# Patient Record
Sex: Female | Born: 1970 | Race: Black or African American | Hispanic: No | Marital: Married | Smoking: Never smoker
Health system: Southern US, Community
[De-identification: ages and names within clinical notes are randomized; demographics above are authoritative.]

## PROBLEM LIST (undated history)

## (undated) HISTORY — PX: TUBAL LIGATION: SHX77

## (undated) HISTORY — PX: CHOLECYSTECTOMY: SHX55

---

## 2019-12-23 ENCOUNTER — Encounter (HOSPITAL_COMMUNITY): Payer: Self-pay | Admitting: Emergency Medicine

## 2019-12-23 ENCOUNTER — Emergency Department (HOSPITAL_COMMUNITY): Payer: 59

## 2019-12-23 ENCOUNTER — Emergency Department (HOSPITAL_COMMUNITY)
Admission: EM | Admit: 2019-12-23 | Discharge: 2019-12-23 | Disposition: A | Discharge status: Home / Self Care | Payer: 59 | Attending: Emergency Medicine | Admitting: Emergency Medicine

## 2019-12-23 ENCOUNTER — Other Ambulatory Visit: Payer: Self-pay

## 2019-12-23 DIAGNOSIS — Z79899 Other long term (current) drug therapy: Secondary | ICD-10-CM | POA: Diagnosis not present

## 2019-12-23 DIAGNOSIS — R109 Unspecified abdominal pain: Secondary | ICD-10-CM | POA: Diagnosis not present

## 2019-12-23 DIAGNOSIS — R197 Diarrhea, unspecified: Secondary | ICD-10-CM | POA: Insufficient documentation

## 2019-12-23 DIAGNOSIS — R0789 Other chest pain: Secondary | ICD-10-CM | POA: Insufficient documentation

## 2019-12-23 DIAGNOSIS — Z20822 Contact with and (suspected) exposure to covid-19: Secondary | ICD-10-CM | POA: Diagnosis not present

## 2019-12-23 LAB — COMPREHENSIVE METABOLIC PANEL
ALT: 35 U/L (ref 0–44)
AST: 27 U/L (ref 15–41)
Albumin: 4 g/dL (ref 3.5–5.0)
Alkaline Phosphatase: 72 U/L (ref 38–126)
Anion gap: 12 (ref 5–15)
BUN: 10 mg/dL (ref 6–20)
CO2: 24 mmol/L (ref 22–32)
Calcium: 9.2 mg/dL (ref 8.9–10.3)
Chloride: 102 mmol/L (ref 98–111)
Creatinine, Ser: 0.86 mg/dL (ref 0.44–1.00)
GFR calc Af Amer: >60 mL/min (ref >60)
GFR calc non Af Amer: >60 mL/min (ref >60)
Glucose, Bld: 93 mg/dL (ref 70–99)
Potassium: 4 mmol/L (ref 3.5–5.1)
Sodium: 138 mmol/L (ref 135–145)
Total Bilirubin: 0.5 mg/dL (ref 0.3–1.2)
Total Protein: 7.5 g/dL (ref 6.5–8.1)

## 2019-12-23 LAB — URINALYSIS, ROUTINE W REFLEX MICROSCOPIC
Bilirubin Urine: NEGATIVE
Glucose, UA: NEGATIVE mg/dL
Ketones, ur: NEGATIVE mg/dL
Leukocytes,Ua: NEGATIVE
Nitrite: NEGATIVE
Protein, ur: NEGATIVE mg/dL
Specific Gravity, Urine: 1.008 (ref 1.005–1.030)
pH: 6 (ref 5.0–8.0)

## 2019-12-23 LAB — I-STAT BETA HCG BLOOD, ED (MC, WL, AP ONLY): I-stat hCG, quantitative: <5 m[IU]/mL (ref <5)

## 2019-12-23 LAB — CBC
HCT: 42 % (ref 36.0–46.0)
Hemoglobin: 14.1 g/dL (ref 12.0–15.0)
MCH: 29.9 pg (ref 26.0–34.0)
MCHC: 33.6 g/dL (ref 30.0–36.0)
MCV: 89 fL (ref 80.0–100.0)
Platelets: 275 10*3/uL (ref 150–400)
RBC: 4.72 MIL/uL (ref 3.87–5.11)
RDW: 12.8 % (ref 11.5–15.5)
WBC: 6.1 10*3/uL (ref 4.0–10.5)
nRBC: 0 % (ref 0.0–0.2)

## 2019-12-23 LAB — PROTIME-INR
INR: 1 (ref 0.8–1.2)
Prothrombin Time: 12.7 seconds (ref 11.4–15.2)

## 2019-12-23 LAB — APTT: aPTT: 28 seconds (ref 24–36)

## 2019-12-23 LAB — LIPASE, BLOOD: Lipase: 30 U/L (ref 11–51)

## 2019-12-23 LAB — SARS CORONAVIRUS 2 BY RT PCR (HOSPITAL ORDER, PERFORMED IN ~~LOC~~ HOSPITAL LAB): SARS Coronavirus 2: NEGATIVE

## 2019-12-23 MED ORDER — SODIUM CHLORIDE 0.9 % IV BOLUS
1000.0000 mL | Freq: Once | INTRAVENOUS | Status: AC
  Administered 2019-12-23: 1000 mL via INTRAVENOUS

## 2019-12-23 MED ORDER — ONDANSETRON HCL 4 MG/2ML IJ SOLN
4.0000 mg | Freq: Once | INTRAMUSCULAR | Status: AC
  Administered 2019-12-23: 4 mg via INTRAVENOUS
  Filled 2019-12-23: qty 2

## 2019-12-23 MED ORDER — ACETAMINOPHEN 325 MG PO TABS
650.0000 mg | ORAL_TABLET | Freq: Once | ORAL | Status: AC
  Administered 2019-12-23: 650 mg via ORAL
  Filled 2019-12-23: qty 2

## 2019-12-23 MED ORDER — SODIUM CHLORIDE 0.9% FLUSH: 3.0000 mL | Freq: Once | INTRAVENOUS | Status: DC

## 2019-12-23 MED ORDER — SODIUM CHLORIDE (PF) 0.9 % IJ SOLN
INTRAMUSCULAR | Status: AC
  Filled 2019-12-23: qty 50

## 2019-12-23 MED ORDER — IOHEXOL 300 MG/ML  SOLN
100.0000 mL | Freq: Once | INTRAMUSCULAR | Status: AC | PRN
  Administered 2019-12-23: 100 mL via INTRAVENOUS

## 2019-12-23 NOTE — ED Provider Notes (Signed)
[facility]-EMERGENCY DEPT Provider Note   CSN: [id number] Arrival date & time: 12/23/19  1828     History Chief Complaint  Patient presents with  . Abdominal Pain  . Shortness of Breath  . Diarrhea    Kristina Mathis is a 49 y.o. female.  HPI Patient with no significant PMH reports 2 days of diarrhea, initially watery brown and then changed to green today. Associated with some nausea and a single episode of self limited sharp L upper chest wall pain. No cough. She began running a fever today. Mild L lower abdominal pain. No dysuria. No recent antibiotics. She took Imodium before coming to the hospital. She has had two Covid vaccines, but not yet 3 weeks since last dose.     History reviewed. No pertinent past medical history.  There are no problems to display for this patient.   Past Surgical History:  Procedure Laterality Date  . CHOLECYSTECTOMY    . TUBAL LIGATION       OB History   No obstetric history on file.     No family history on file.  Social History   Tobacco Use  . Smoking status: Not on file  Substance Use Topics  . Alcohol use: Not on file  . Drug use: Not on file    Home Medications Prior to Admission medications   Medication Sig Start Date End Date Taking? Authorizing Provider  FIBER PO Take 2 capsules by mouth daily.   Yes [provider]  TURMERIC PO Take 2 capsules by mouth once.   Yes [provider]    Allergies    Patient has no known allergies.  Review of Systems   Review of Systems A comprehensive review of systems was completed and negative except as noted in HPI.   Physical Exam Updated Vital Signs BP 122/88   Pulse 69   Temp (!) 100.4 F (38 C) (Oral)   Resp 18   LMP 09/22/2019   SpO2 97%   Physical Exam Vitals and nursing note reviewed.  Constitutional:      Appearance: Normal appearance.  HENT:     Head: Normocephalic and atraumatic.     Nose: Nose normal.   Mouth/Throat:     Mouth: Mucous membranes are moist.  Eyes:     Extraocular Movements: Extraocular movements intact.     Conjunctiva/sclera: Conjunctivae normal.  Cardiovascular:     Rate and Rhythm: Normal rate.  Pulmonary:     Effort: Pulmonary effort is normal.     Breath sounds: Normal breath sounds.  Chest:     Chest wall: No tenderness.  Abdominal:     General: Abdomen is flat.     Palpations: Abdomen is soft.     Tenderness: There is no abdominal tenderness. There is no guarding. Negative signs include Murphy's sign.  Musculoskeletal:        General: No swelling. Normal range of motion.     Cervical back: Neck supple.  Skin:    General: Skin is warm and dry.  Neurological:     General: No focal deficit present.     Mental Status: She is alert.  Psychiatric:        Mood and Affect: Mood normal.     ED Results / Procedures / Treatments   Labs (all labs ordered are listed, but only abnormal results are displayed) Labs Reviewed  URINALYSIS, ROUTINE W REFLEX MICROSCOPIC - Abnormal; Notable for the following components:  Result Value   Color, Urine STRAW (*)    Hgb urine dipstick SMALL (*)    Bacteria, UA RARE (*)    All other components within normal limits  SARS CORONAVIRUS 2 BY RT PCR (HOSPITAL ORDER, PERFORMED IN [facility])  LIPASE, BLOOD  COMPREHENSIVE METABOLIC PANEL  CBC  PROTIME-INR  APTT  I-STAT BETA HCG BLOOD, ED ([facility], [facility], [facility] ONLY)    EKG EKG Interpretation  Date/Time:  Sunday December 23 2019 18:40:04 EDT Ventricular Rate:  75 PR Interval:    QRS Duration: 96 QT Interval:  361 QTC Calculation: 404 R Axis:   48 Text Interpretation: Sinus rhythm RSR' in V1 or V2, right VCD or RVH 12 Lead; Mason-Likar No old tracing to compare Confirmed by Meridee Score [phone]) on 12/23/2019 7:07:03 PM   Radiology DG Chest 2 View  Result Date: 12/23/2019 CLINICAL DATA:  Shortness of breath. Left-sided abdominal pain. Fever. Diarrhea. EXAM: CHEST -  2 VIEW COMPARISON:  None. FINDINGS: The cardiomediastinal contours are normal. Mild elevation of right hemidiaphragm. Pulmonary vasculature is normal. No consolidation, pleural effusion, or pneumothorax. No acute osseous abnormalities are seen. IMPRESSION: No acute chest findings. Electronically Signed   By: Narda Rutherford M.D.   On: 12/23/2019 19:11   CT Abdomen Pelvis W Contrast  Result Date: 12/23/2019 CLINICAL DATA:  Diarrhea, left-sided abdominal pain EXAM: CT ABDOMEN AND PELVIS WITH CONTRAST TECHNIQUE: Multidetector CT imaging of the abdomen and pelvis was performed using the standard protocol following bolus administration of intravenous contrast. CONTRAST:  OMNIPAQUE IOHEXOL 300 MG/ML  SOLN COMPARISON:  None. FINDINGS: Lower chest: No acute pleural or parenchymal lung disease. Hepatobiliary: Diffuse hepatic steatosis. No focal abnormalities. No intrahepatic duct dilation. The gallbladder is surgically absent. Pancreas: Unremarkable. No pancreatic ductal dilatation or surrounding inflammatory changes. Spleen: Normal in size without focal abnormality. Adrenals/Urinary Tract: Adrenal glands are unremarkable. Kidneys are normal, without renal calculi, focal lesion, or hydronephrosis. Bladder is unremarkable. Stomach/Bowel: No bowel obstruction or ileus. No bowel wall thickening or inflammatory change. Normal appendix right lower quadrant. Vascular/Lymphatic: No significant vascular findings are present. No enlarged abdominal or pelvic lymph nodes. Reproductive: 3.7 cm fibroid right lower uterine segment. No adnexal masses. Other: No abdominal wall hernia or abnormality. No abdominopelvic ascites. Musculoskeletal: No acute or destructive bony lesions. Reconstructed images demonstrate no additional findings. IMPRESSION: 1. No acute intra-abdominal or intrapelvic process. 2. Diffuse hepatic steatosis. 3. Fibroid uterus. Electronically Signed   By: Sharlet Salina M.D.   On: 12/23/2019 21:24     Procedures Procedures (including critical care time)  Medications Ordered in ED Medications  sodium chloride flush (NS) 0.9 % injection 3 mL ( Intravenous Not Given 12/23/19 2237)  sodium chloride 0.9 % bolus 1,000 mL (0 mLs Intravenous Stopped 12/23/19 2237)  acetaminophen (TYLENOL) tablet 650 mg (650 mg Oral Given 12/23/19 2026)  ondansetron (ZOFRAN) injection 4 mg (4 mg Intravenous Given 12/23/19 2038)  iohexol (OMNIPAQUE) 300 MG/ML solution 100 mL (100 mLs Intravenous Contrast Given 12/23/19 2108)    ED Course  I have reviewed the triage vital signs and the nursing notes.  Pertinent labs & imaging results that were available during my care of the patient were reviewed by me and considered in my medical decision making (see chart for details).  Clinical Course as of Dec 22 2317  Wynelle Link Dec 23, 2019  1944 WBC is normal.    [CS]  2012 CMP, Lipase normal   [CS]  2240 Labs and imaging results reviewed  and negative including CT and Covid. She is resting comfortably, no further diarrhea. Vitals remain stable. Likely a viral process, will d/c with continued imodium, oral fluids and PCP followup.    [CS]    Clinical Course User Index [CS] Truddie Hidden, MD   MDM Rules/Calculators/A&P                      Patient with fever, diarrhea and abdominal discomfort. Will check labs, give IVF, APAP and send for CT AP.  Final Clinical Impression(s) / ED Diagnoses Final diagnoses:  Diarrhea of presumed infectious origin    Rx / DC Orders ED Discharge Orders    None       Truddie Hidden, MD 12/23/19 2319

## 2019-12-23 NOTE — ED Notes (Signed)
Pt ambulatory to RR. Urine cup provided

## 2019-12-23 NOTE — ED Notes (Signed)
Pt verbalizes understanding of DC instructions. Pt belongings returned and is ambulatory out of ED.   Signature pad not available  

## 2019-12-23 NOTE — ED Triage Notes (Signed)
Pt c/o severe diarrhea, left abd pains and SOb since yesterday. Reports having a fever 99.4 today. Pt took imodium about hour ago.

## 2020-01-16 ENCOUNTER — Other Ambulatory Visit: Payer: Self-pay | Admitting: Endocrinology

## 2020-01-16 DIAGNOSIS — Z1231 Encounter for screening mammogram for malignant neoplasm of breast: Secondary | ICD-10-CM

## 2020-02-22 ENCOUNTER — Ambulatory Visit: Payer: 59

## 2020-03-21 ENCOUNTER — Other Ambulatory Visit: Payer: Self-pay

## 2020-03-21 ENCOUNTER — Ambulatory Visit: Payer: 59

## 2020-03-21 ENCOUNTER — Ambulatory Visit
Admission: RE | Admit: 2020-03-21 | Discharge: 2020-03-21 | Disposition: A | Discharge status: Home / Self Care | Payer: 59 | Source: Ambulatory Visit | Attending: Endocrinology | Admitting: Endocrinology

## 2020-03-21 DIAGNOSIS — Z1231 Encounter for screening mammogram for malignant neoplasm of breast: Secondary | ICD-10-CM

## 2020-04-04 ENCOUNTER — Ambulatory Visit: Payer: 59 | Admitting: Family Medicine

## 2020-04-07 ENCOUNTER — Other Ambulatory Visit: Payer: Self-pay | Admitting: Endocrinology

## 2020-04-07 DIAGNOSIS — R928 Other abnormal and inconclusive findings on diagnostic imaging of breast: Secondary | ICD-10-CM

## 2020-04-18 ENCOUNTER — Ambulatory Visit
Admission: RE | Admit: 2020-04-18 | Discharge: 2020-04-18 | Disposition: A | Discharge status: Home / Self Care | Payer: 59 | Source: Ambulatory Visit | Attending: Endocrinology | Admitting: Endocrinology

## 2020-04-18 ENCOUNTER — Other Ambulatory Visit: Payer: Self-pay

## 2020-04-18 ENCOUNTER — Ambulatory Visit: Payer: 59

## 2020-04-18 DIAGNOSIS — R928 Other abnormal and inconclusive findings on diagnostic imaging of breast: Secondary | ICD-10-CM

## 2021-05-10 IMAGING — MG MM DIGITAL DIAGNOSTIC UNILAT*R* W/ TOMO W/ CAD
4 series · 4 of 12 positions shown · non-contrast
Comparison: Previous exam(s).

CLINICAL DATA: Patient returns after screening study for evaluation
of possible RIGHT breast asymmetry.

EXAM:
DIGITAL DIAGNOSTIC UNILATERAL RIGHT MAMMOGRAM WITH TOMO AND CAD

[R ML synth-2D]
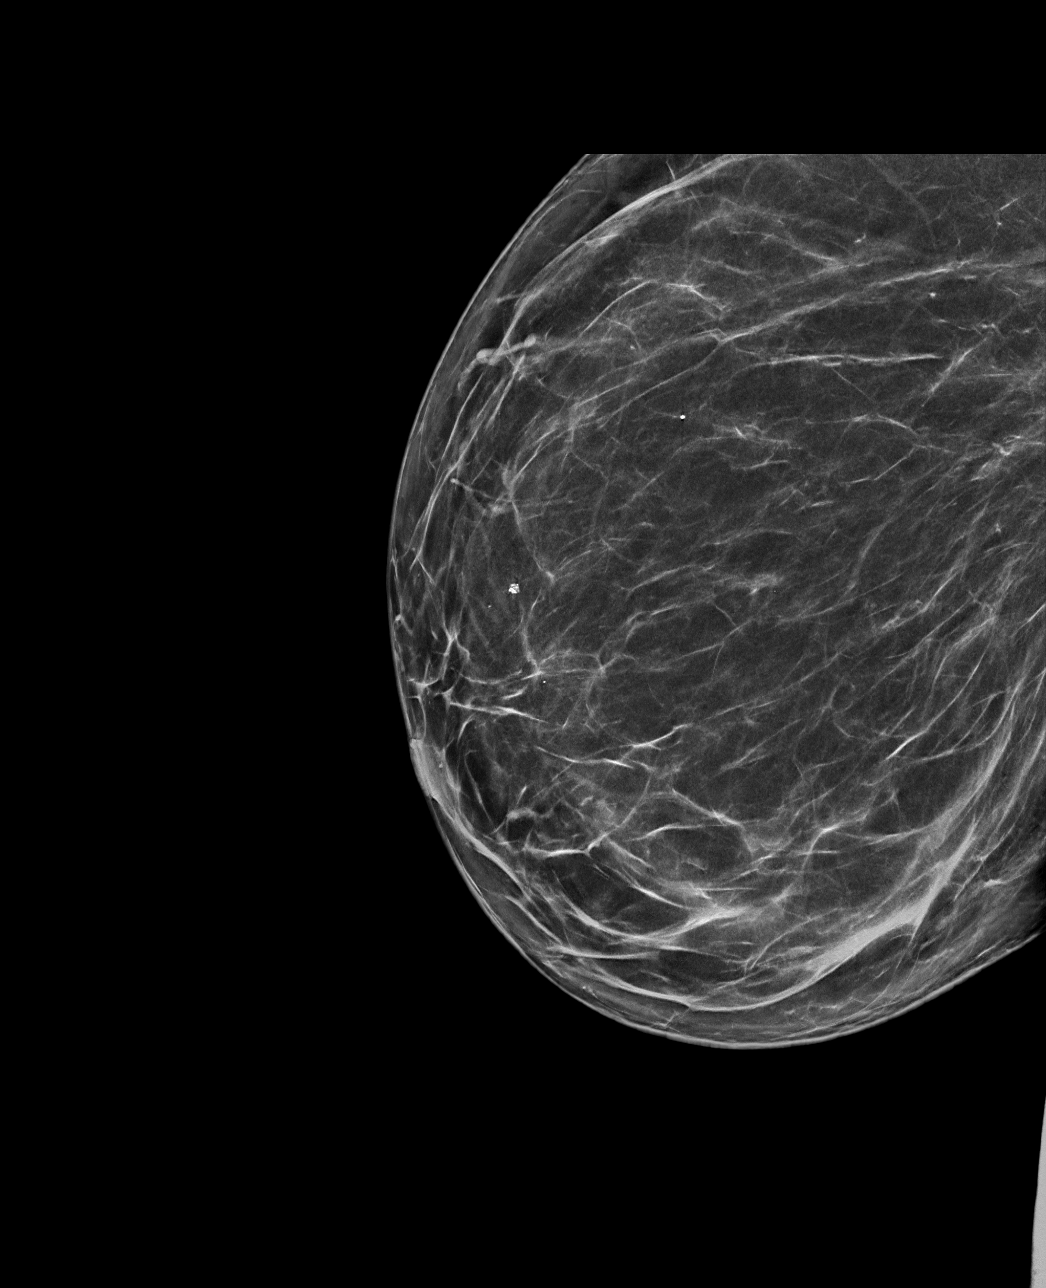

[R CC synth-2D]
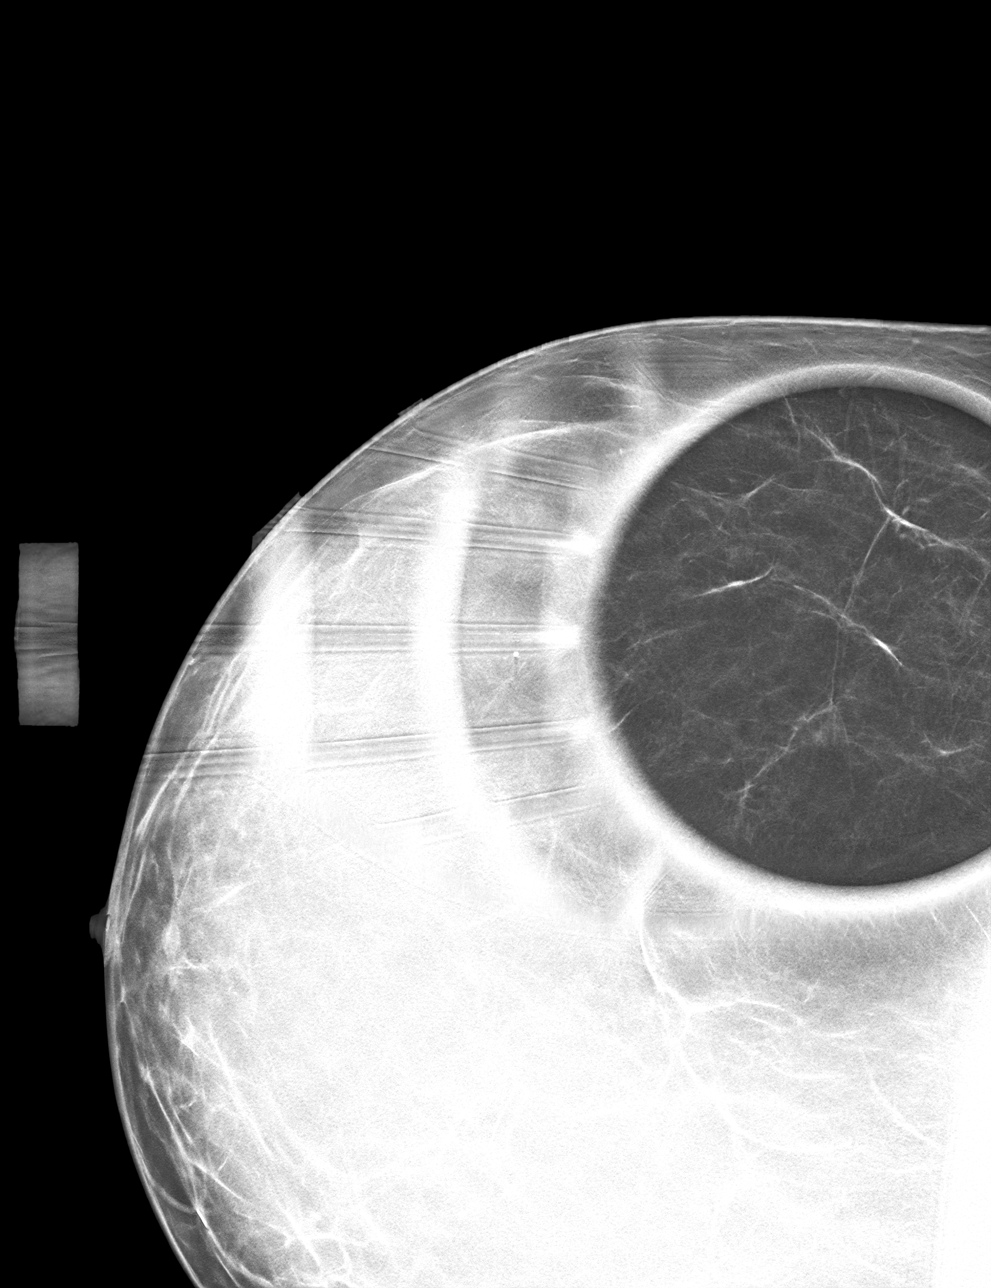

[R CC tomo · tomo slice 27/53.0]
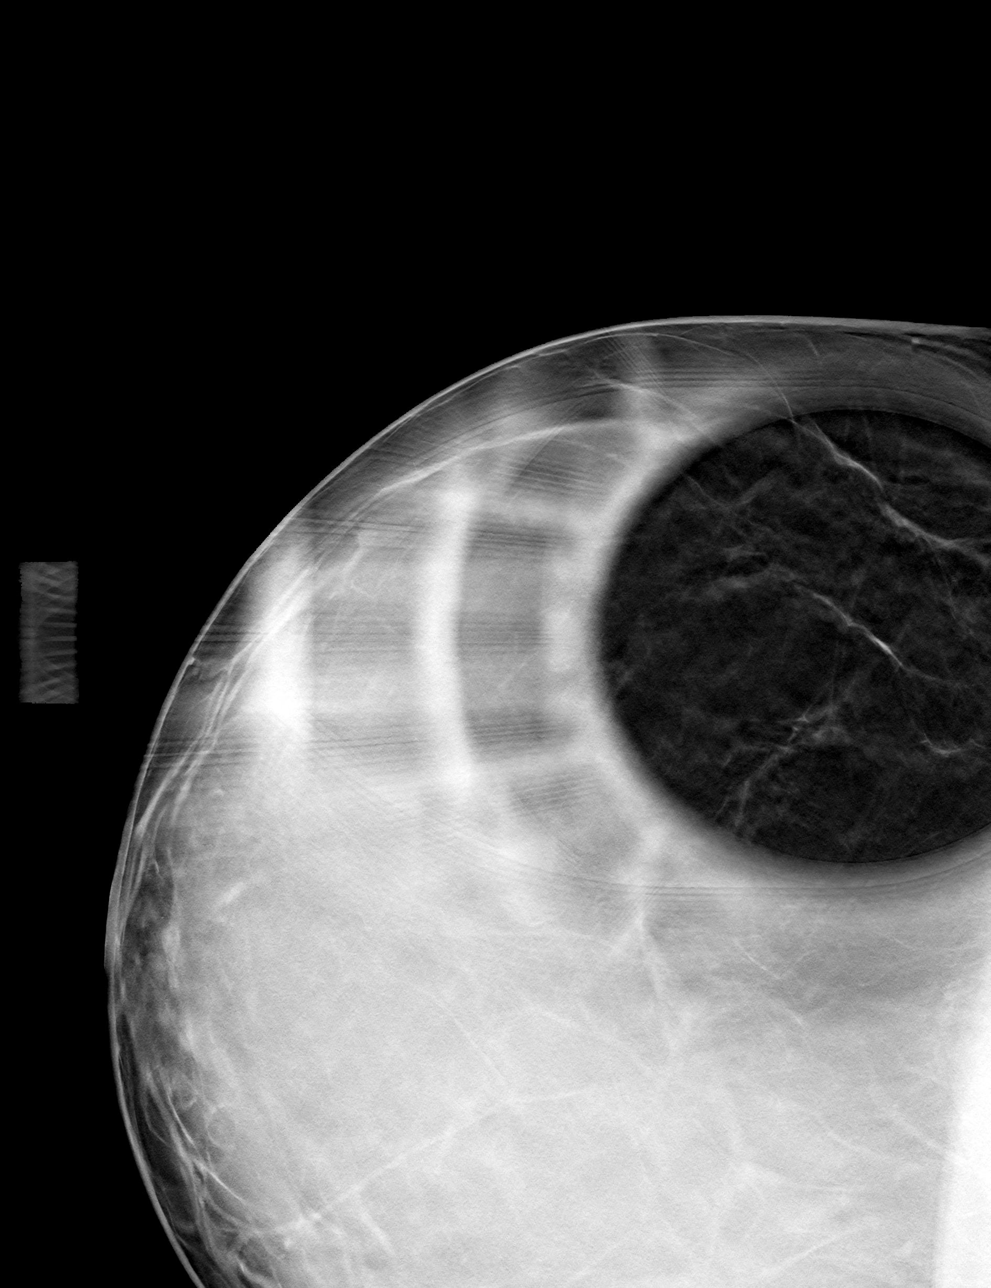

[R ML tomo · tomo slice 39/77.0]
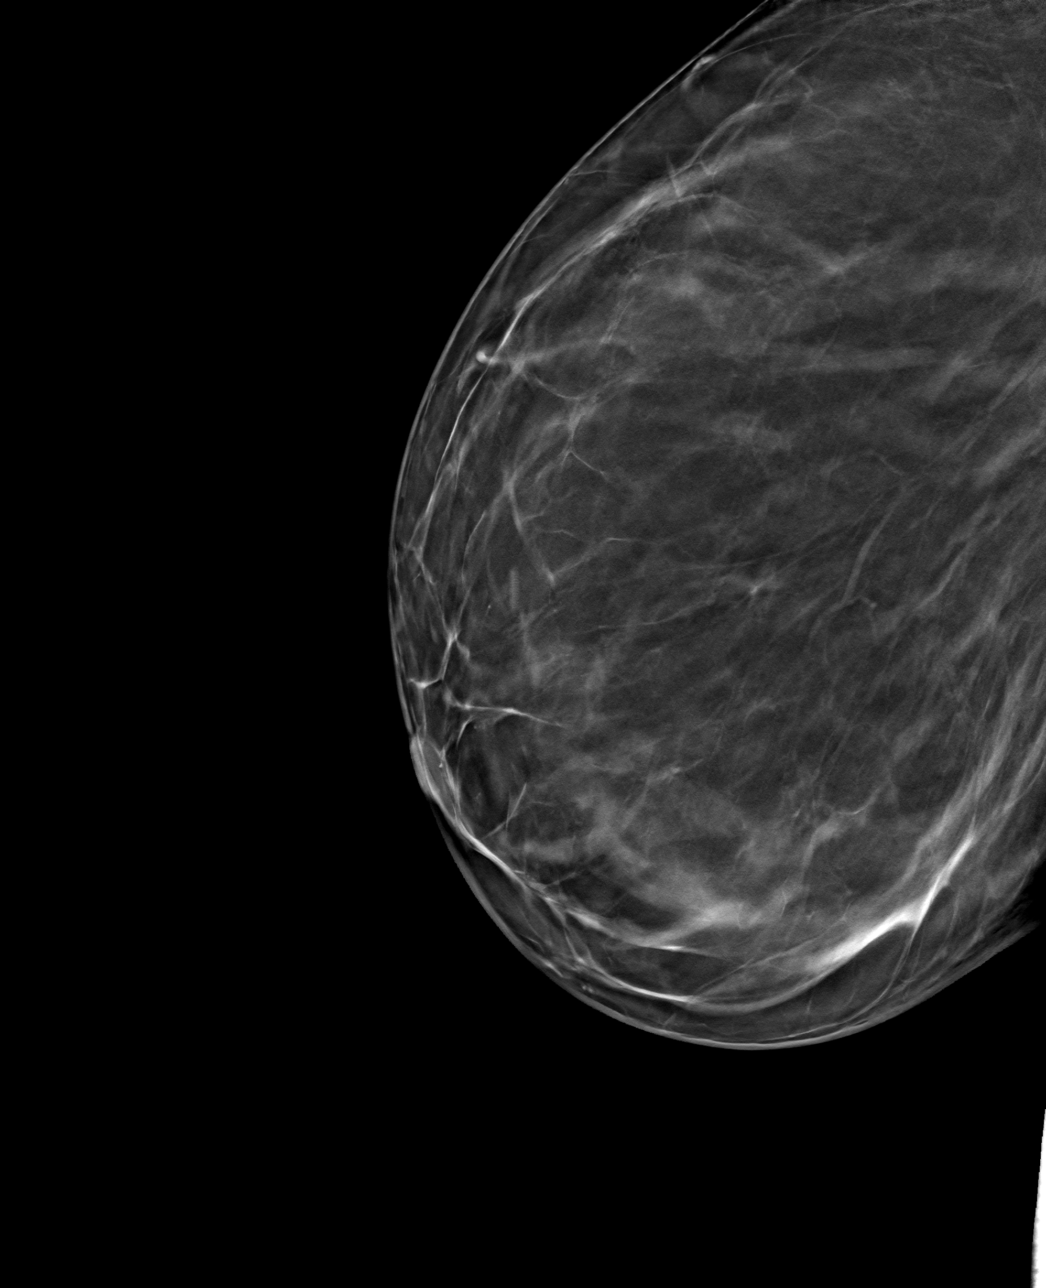

[4 of 12 positions shown; findings below may reference images not displayed]

ACR Breast Density Category b: There are scattered areas of
fibroglandular density.
FINDINGS: Additional 2-D and 3-D images are performed. These views show no
persistent asymmetry in the LATERAL aspect of the RIGHT breast. No
suspicious mass, distortion, or microcalcifications are identified
to suggest presence of malignancy.

Mammographic images were processed with CAD.
IMPRESSION: No mammographic evidence for malignancy.

RECOMMENDATION:
Screening mammogram in one year.(Code:GL-J-KZO)

I have discussed the findings and recommendations with the patient
and her husband. If applicable, a reminder letter will be sent to
the patient regarding the next appointment.

BI-RADS CATEGORY  1: Negative.

## 2021-12-05 ENCOUNTER — Emergency Department (HOSPITAL_BASED_OUTPATIENT_CLINIC_OR_DEPARTMENT_OTHER)
Admission: EM | Admit: 2021-12-05 | Discharge: 2021-12-05 | Disposition: A | Discharge status: Home / Self Care | Payer: PRIVATE HEALTH INSURANCE | Attending: Emergency Medicine | Admitting: Emergency Medicine

## 2021-12-05 ENCOUNTER — Encounter (HOSPITAL_BASED_OUTPATIENT_CLINIC_OR_DEPARTMENT_OTHER): Payer: Self-pay | Admitting: Emergency Medicine

## 2021-12-05 ENCOUNTER — Emergency Department (HOSPITAL_BASED_OUTPATIENT_CLINIC_OR_DEPARTMENT_OTHER): Payer: PRIVATE HEALTH INSURANCE | Admitting: Radiology

## 2021-12-05 DIAGNOSIS — Y990 Civilian activity done for income or pay: Secondary | ICD-10-CM | POA: Insufficient documentation

## 2021-12-05 DIAGNOSIS — S6991XA Unspecified injury of right wrist, hand and finger(s), initial encounter: Secondary | ICD-10-CM | POA: Diagnosis present

## 2021-12-05 DIAGNOSIS — X500XXA Overexertion from strenuous movement or load, initial encounter: Secondary | ICD-10-CM | POA: Insufficient documentation

## 2021-12-05 DIAGNOSIS — S6391XA Sprain of unspecified part of right wrist and hand, initial encounter: Secondary | ICD-10-CM | POA: Diagnosis not present

## 2021-12-05 MED ORDER — IBUPROFEN 800 MG PO TABS
800.0000 mg | ORAL_TABLET | Freq: Once | ORAL | Status: AC
  Administered 2021-12-05: 800 mg via ORAL
  Filled 2021-12-05: qty 1

## 2021-12-05 NOTE — Discharge Instructions (Signed)
Take Tylenol Motrin as needed for your hand pain.  Follow-up with your primary care doctor.

## 2021-12-05 NOTE — ED Triage Notes (Signed)
Pt injured right thumb at work on thursday while trying to manually brake a piece of equipment.

## 2021-12-05 NOTE — ED Provider Notes (Signed)
[facility] EMERGENCY DEPT Provider Note   CSN: [id number] Arrival date & time: 12/05/21  1043     History  Chief Complaint  Patient presents with   Hand Injury    Kristina Mathis is a 51 y.o. female.  Presents to ER for hand injury.  States that on Thursday she injured her right thumb at work while manipulating the manual break.  Has been bothering her ever since.  Pain primarily to the right thumb and base of thumb hand region.  No numbness or tingling, no obvious alleviating or aggravating factors.  No major medical problems.  HPI     Home Medications Prior to Admission medications   Medication Sig Start Date End Date Taking? Authorizing Provider  FIBER PO Take 2 capsules by mouth daily.    [provider]  TURMERIC PO Take 2 capsules by mouth once.    [provider]      Allergies    Patient has no known allergies.    Review of Systems   Review of Systems  Musculoskeletal:  Positive for arthralgias.  All other systems reviewed and are negative.  Physical Exam Updated Vital Signs BP 119/78 (BP Location: Left Arm)   Pulse 72   Temp 98.5 F (36.9 C) (Oral)   Resp 18   Ht 5\' 6"  (1.676 m)   Wt 95.7 kg   SpO2 99%   BMI 34.06 kg/m  Physical Exam Vitals and nursing note reviewed.  Constitutional:      General: She is not in acute distress.    Appearance: She is well-developed.  HENT:     Head: Normocephalic and atraumatic.  Eyes:     Conjunctiva/sclera: Conjunctivae normal.  Cardiovascular:     Rate and Rhythm: Normal rate and regular rhythm.     Pulses: Normal pulses.  Pulmonary:     Effort: Pulmonary effort is normal. No respiratory distress.  Musculoskeletal:        General: No swelling.     Cervical back: Neck supple.     Comments: Some tenderness to the right hand, right thumb, no significant deformity noted, normal radial pulse, normal sensation, normal motor  Skin:    General: Skin is warm and dry.      Capillary Refill: Capillary refill takes less than 2 seconds.  Neurological:     General: No focal deficit present.     Mental Status: She is alert.  Psychiatric:        Mood and Affect: Mood normal.    ED Results / Procedures / Treatments   Labs (all labs ordered are listed, but only abnormal results are displayed) Labs Reviewed - No data to display  EKG None  Radiology DG Hand Complete Right  Result Date: 12/05/2021 CLINICAL DATA:  Trauma, pain EXAM: RIGHT HAND - COMPLETE 3+ VIEW COMPARISON:  None Available. FINDINGS: No fracture or dislocation is seen in the right hand. Degenerative changes are noted in the first metacarpophalangeal joint. Minimal bony spurs seen in the interphalangeal joint of the right thumb. There are no opaque foreign bodies. IMPRESSION: No recent fracture or dislocation is seen. Moderate degenerative changes are noted in the right first metacarpophalangeal joint. Minimal degenerative changes are noted in the interphalangeal joint of right thumb. Electronically Signed   By: 12/07/2021 M.D.   On: 12/05/2021 11:12    Procedures Procedures    Medications Ordered in ED Medications  ibuprofen (ADVIL) tablet 800 mg (800 mg Oral Given 12/05/21 1241)  ED Course/ Medical Decision Making/ A&P                           Medical Decision Making Amount and/or Complexity of Data Reviewed Radiology: ordered.  Risk Prescription drug management.   51 year old presents to ER due to concern for right hand pain.  On exam patient well-appearing, some tenderness to her right hand but no other traumatic concerns or findings.  I independently reviewed and interpreted her x-ray, no acute fracture or dislocation.  Provided Motrin, pain improved while in ER.  Advised OTC NSAIDs, Tylenol as needed for pain control, discharged home.    After the discussed management above, the patient was determined to be safe for discharge.  The patient was in agreement with this  plan and all questions regarding their care were answered.  ED return precautions were discussed and the patient will return to the ED with any significant worsening of condition.         Final Clinical Impression(s) / ED Diagnoses Final diagnoses:  Hand sprain, right, initial encounter    Rx / DC Orders ED Discharge Orders     None         Milagros Loll, MD 12/06/21 [phone]

## 2022-05-17 ENCOUNTER — Ambulatory Visit
Admission: EM | Admit: 2022-05-17 | Discharge: 2022-05-17 | Disposition: A | Discharge status: Home / Self Care | Payer: BC Managed Care – PPO | Attending: Emergency Medicine | Admitting: Emergency Medicine

## 2022-05-17 DIAGNOSIS — J22 Unspecified acute lower respiratory infection: Secondary | ICD-10-CM

## 2022-05-17 DIAGNOSIS — J329 Chronic sinusitis, unspecified: Secondary | ICD-10-CM

## 2022-05-17 DIAGNOSIS — J989 Respiratory disorder, unspecified: Secondary | ICD-10-CM

## 2022-05-17 DIAGNOSIS — R062 Wheezing: Secondary | ICD-10-CM

## 2022-05-17 MED ORDER — AEROCHAMBER PLUS FLO-VU LARGE MISC: 1.0000 | Freq: Once | 0 refills | Status: AC

## 2022-05-17 MED ORDER — GUAIFENESIN 400 MG PO TABS: ORAL_TABLET | ORAL | 0 refills | Status: AC

## 2022-05-17 MED ORDER — METHYLPREDNISOLONE 8 MG PO TABS: 8.0000 mg | ORAL_TABLET | Freq: Every day | ORAL | 0 refills | Status: AC

## 2022-05-17 MED ORDER — ALBUTEROL SULFATE (2.5 MG/3ML) 0.083% IN NEBU
2.5000 mg | INHALATION_SOLUTION | Freq: Once | RESPIRATORY_TRACT | Status: AC
  Administered 2022-05-17: 2.5 mg via RESPIRATORY_TRACT

## 2022-05-17 MED ORDER — IPRATROPIUM BROMIDE 0.06 % NA SOLN: 2.0000 | Freq: Three times a day (TID) | NASAL | 1 refills | Status: AC

## 2022-05-17 MED ORDER — PROMETHAZINE-DM 6.25-15 MG/5ML PO SYRP: 5.0000 mL | ORAL_SOLUTION | Freq: Four times a day (QID) | ORAL | 0 refills | Status: AC | PRN

## 2022-05-17 MED ORDER — AZITHROMYCIN 250 MG PO TABS: ORAL_TABLET | ORAL | 0 refills | Status: AC

## 2022-05-17 MED ORDER — METHYLPREDNISOLONE SODIUM SUCC 125 MG IJ SOLR
125.0000 mg | Freq: Once | INTRAMUSCULAR | Status: AC
  Administered 2022-05-17: 125 mg via INTRAMUSCULAR

## 2022-05-17 MED ORDER — ALBUTEROL SULFATE HFA 108 (90 BASE) MCG/ACT IN AERS: 2.0000 | INHALATION_SPRAY | Freq: Four times a day (QID) | RESPIRATORY_TRACT | 0 refills | Status: AC | PRN

## 2022-05-17 NOTE — Discharge Instructions (Signed)
Please read below to learn more about the medications, dosages and frequencies that I recommend to help alleviate your symptoms and to get you feeling better soon:   Z-Pak (azithromycin):  take 2 tablets the first day, then take 1 tablet daily every day thereafter until complete.      Solu-Medrol IM (methylprednisolone):  To quickly address your significant respiratory inflammation, you were provided with an injection of Solu-Medrol in the office today.  You should continue to feel the full benefit of the steroid for the next 4 to 6 hours.    Medrol Dosepak (methylprednisolone): This is a steroid that will significantly calm your upper and lower airways, please take the daily recommended quantity of tablets daily with your breakfast meal starting tomorrow morning until the prescription is complete.      Atrovent (ipratropium): This is an excellent nasal decongestant spray that does not cause rebound congestion, please instill 2 sprays into each nare with each use.  Please use the spray up to 4 times daily as needed.  I have provided you with a prescription for this medication.      ProAir, Ventolin, Proventil (albuterol): This inhaled medication contains a short acting beta agonist bronchodilator.  This medication works on the smooth muscle that opens and constricts of your airways by relaxing the muscle.  The result of relaxation of the smooth muscle is increased air movement and improved work of breathing.  This is a short acting medication that can be used every 4-6 hours as needed for increased work of breathing, shortness of breath, wheezing and excessive coughing.  I have provided you with a prescription.    Robitussin, Mucinex (guaifenesin): This is an expectorant.  This helps break up chest congestion and loosen up thick nasal drainage making phlegm and drainage more liquid and therefore easier to remove.  I recommend being 400 mg three times daily as needed.      Promethazine DM: Promethazine  is both a nasal decongestant and an antinausea medication that makes most patients feel fairly sleepy.  The DM is dextromethorphan, a cough suppressant found in many over-the-counter cough medications.  Please take 5 mL before bedtime to minimize your cough which will help you sleep better.  I have sent a prescription for this medication to your pharmacy.   Please follow-up within the next 5-7 days either with your primary care provider or urgent care if your symptoms do not resolve.  If you do not have a primary care provider, we will assist you in finding one.        Thank you for visiting urgent care today.  We appreciate the opportunity to participate in your care.

## 2022-05-17 NOTE — ED Provider Notes (Signed)
[facility]    CSN: [id number] Arrival date & time: 05/17/22  1912    HISTORY   Chief Complaint  Patient presents with   Cough   Generalized Body Aches   HPI Kristina Mathis is a pleasant, 50 y.o. female who presents to urgent care today. Pt states Friday she began having SOB, and cough. The patient states at home covid test was negative last night.  Patient reports recent travel out of state, denies known sick contacts.  Patient denies history of allergies and asthma.  Patient states that when she coughs, is not productive of sputum but she feels the cough is very deep and has a burning sensation in her upper chest after coughing.  Patient has normal vital signs on arrival today albeit slightly diminished oxygen saturation.  Patient denies fever, body aches, chills, nausea, vomiting, diarrhea.  Patient states when she takes a deep breath she feels that her breathing is labored, gets easily tired out with minimal exertion.  Patient states that this is new.  The history is provided by the patient.   History reviewed. No pertinent past medical history. There are no problems to display for this patient.  Past Surgical History:  Procedure Laterality Date   CHOLECYSTECTOMY     TUBAL LIGATION     OB History   No obstetric history on file.    Home Medications    Prior to Admission medications   Medication Sig Start Date End Date Taking? Authorizing Provider  FIBER PO Take 2 capsules by mouth daily.    [provider]  TURMERIC PO Take 2 capsules by mouth once.    [provider]    Family History Family History  Problem Relation Age of Onset   Breast cancer Neg Hx    Social History Social History   Tobacco Use   Smoking status: Never   Smokeless tobacco: Never  Vaping Use   Vaping Use: Never used  Substance Use Topics   Alcohol use: Never   Drug use: Never   Allergies   Patient has no known allergies.  Review of  Systems Review of Systems Pertinent findings revealed after performing a 14 point review of systems has been noted in the history of present illness.  Physical Exam Triage Vital Signs ED Triage Vitals  Enc Vitals Group     BP 05/15/21 0827 (!) 147/82     Pulse Rate 05/15/21 0827 72     Resp 05/15/21 0827 18     Temp 05/15/21 0827 98.3 F (36.8 C)     Temp Source 05/15/21 0827 Oral     SpO2 05/15/21 0827 98 %     Weight --      Height --      Head Circumference --      Peak Flow --      Pain Score 05/15/21 0826 5     Pain Loc --      Pain Edu? --      Excl. in [place]? --   No data found.  Updated Vital Signs BP 118/77 (BP Location: Right Arm)   Pulse 89   Temp 98.4 F (36.9 C) (Oral)   Resp 16   LMP  (LMP Unknown)   SpO2 95%   Physical Exam Vitals and nursing note reviewed.  Constitutional:      General: She is not in acute distress.    Appearance: Normal appearance. She is not ill-appearing.  HENT:     Head:  Normocephalic and atraumatic.     Salivary Glands: Right salivary gland is not diffusely enlarged or tender. Left salivary gland is not diffusely enlarged or tender.     Right Ear: Hearing, tympanic membrane, ear canal and external ear normal. No drainage. No middle ear effusion. There is no impacted cerumen. Tympanic membrane is not injected, erythematous or bulging.     Left Ear: Hearing, tympanic membrane, ear canal and external ear normal. No drainage.  No middle ear effusion. There is no impacted cerumen. Tympanic membrane is not injected, erythematous or bulging.     Ears:     Comments: Bilateral EACs normal, both TMs bulging with clear fluid    Nose: Mucosal edema and rhinorrhea present. No nasal deformity, septal deviation, signs of injury, nasal tenderness or congestion. Rhinorrhea is clear.     Right Nostril: No foreign body, epistaxis, septal hematoma or occlusion.     Left Nostril: No foreign body, epistaxis, septal hematoma or occlusion.     Right  Turbinates: Enlarged and swollen. Not pale.     Left Turbinates: Enlarged and swollen. Not pale.     Right Sinus: No maxillary sinus tenderness or frontal sinus tenderness.     Left Sinus: No maxillary sinus tenderness or frontal sinus tenderness.     Mouth/Throat:     Lips: Pink. No lesions.     Mouth: Mucous membranes are moist. No oral lesions.     Pharynx: Oropharynx is clear. Uvula midline. No posterior oropharyngeal erythema or uvula swelling.     Tonsils: No tonsillar exudate. 0 on the right. 0 on the left.     Comments: Postnasal drip Eyes:     General: Lids are normal.        Right eye: No discharge.        Left eye: No discharge.     Extraocular Movements: Extraocular movements intact.     Conjunctiva/sclera: Conjunctivae normal.     Right eye: Right conjunctiva is not injected.     Left eye: Left conjunctiva is not injected.     Pupils: Pupils are equal, round, and reactive to light.  Neck:     Trachea: Trachea and phonation normal.  Cardiovascular:     Rate and Rhythm: Normal rate and regular rhythm.     Pulses: Normal pulses.     Heart sounds: Normal heart sounds. No murmur heard.    No friction rub. No gallop.  Pulmonary:     Effort: Pulmonary effort is normal. No tachypnea, bradypnea, accessory muscle usage, prolonged expiration, respiratory distress or retractions.     Breath sounds: No stridor, decreased air movement or transmitted upper airway sounds. Examination of the right-upper field reveals decreased breath sounds. Examination of the left-upper field reveals decreased breath sounds. Examination of the right-middle field reveals decreased breath sounds. Examination of the left-middle field reveals decreased breath sounds. Examination of the right-lower field reveals decreased breath sounds. Examination of the left-lower field reveals decreased breath sounds. Decreased breath sounds present. No wheezing, rhonchi or rales.  Chest:     Chest wall: No tenderness.   Musculoskeletal:        General: Normal range of motion.     Cervical back: Full passive range of motion without pain, normal range of motion and neck supple. Normal range of motion.  Lymphadenopathy:     Cervical: Cervical adenopathy present.     Right cervical: Superficial cervical adenopathy present.     Left cervical: Superficial cervical adenopathy present.  Skin:  General: Skin is warm and dry.     Findings: No erythema or rash.  Neurological:     General: No focal deficit present.     Mental Status: She is alert and oriented to person, place, and time.  Psychiatric:        Mood and Affect: Mood normal.        Behavior: Behavior normal.     Visual Acuity Right Eye Distance:   Left Eye Distance:   Bilateral Distance:    Right Eye Near:   Left Eye Near:    Bilateral Near:     [facility] Couse / Diagnostics / Procedures:     Radiology No results found.  Procedures Procedures (including critical care time) EKG  Pending results:  Labs Reviewed - No data to display  Medications Ordered in UC: Medications  methylPREDNISolone sodium succinate (SOLU-MEDROL) 125 mg/2 mL injection 125 mg (has no administration in time range)  albuterol (PROVENTIL) (2.5 MG/3ML) 0.083% nebulizer solution 2.5 mg (2.5 mg Nebulization Given 05/17/22 2026)    UC Diagnoses / Final Clinical Impressions(s)   I have reviewed the triage vital signs and the nursing notes.  Pertinent labs & imaging results that were available during my care of the patient were reviewed by me and considered in my medical decision making (see chart for details).    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  Patient advised to continue steroid treatment with 3 more days of  methylprednisolone.  Given duration of symptoms, patient was also provided with azithromycin to eradicate any bacteria that might be perpetuating her cough and inflammation in her lungs.  Patient was advised to use ipratropium nasal spray to dry up nasal secretions which is likely triggering her to cough more often.  Guaifenesin was prescribed to thin secretions and make her coughing more efficient.  Promethazine DM was provided for nighttime cough to help patient get some sleep.  Return precautions advised.  ED Prescriptions     Medication Sig Dispense Auth. Provider   Spacer/Aero-Holding Chambers (AEROCHAMBER PLUS FLO-VU LARGE) MISC 1 each by Other route once for 1 dose. 1 each Theadora Rama Scales, PA-C   albuterol (VENTOLIN HFA) 108 (90 Base) MCG/ACT inhaler Inhale 2 puffs into the lungs every 6 (six) hours as needed for wheezing or shortness of breath (Cough). 18 g Theadora Rama Scales, PA-C   methylPREDNISolone (MEDROL) 8 MG tablet Take 1 tablet (8 mg total) by mouth daily for 3 days. 3 tablet Theadora Rama Scales, PA-C   azithromycin (ZITHROMAX) 250 MG tablet Take 2 tablets (500 mg total) by mouth daily for 1 day, THEN 1 tablet (250 mg total) daily for 4 days. 6 tablet Theadora Rama Scales, PA-C   guaifenesin (HUMIBID E) 400 MG TABS tablet Take 1 tablet 3 times daily as needed for chest congestion and cough 21 tablet Theadora Rama Scales, PA-C   promethazine-dextromethorphan (PROMETHAZINE-DM) 6.25-15 MG/5ML syrup Take 5 mLs by mouth 4 (four) times daily as needed for cough. 118 mL Theadora Rama Scales, PA-C   ipratropium (ATROVENT) 0.06 % nasal spray Place 2 sprays into both nostrils 3 (three) times daily. As needed for nasal congestion, runny nose 15 mL Theadora Rama Scales, PA-C      PDMP not reviewed this encounter.  Disposition Upon Discharge:  Condition: stable for discharge home Home: take medications as prescribed; routine discharge instructions as discussed; follow up  as advised.  Patient presented with an acute illness with associated systemic symptoms and significant discomfort requiring urgent management. In my opinion, this is a condition that a prudent lay person (someone who possesses an average knowledge of health and medicine) may potentially expect to result in complications if not addressed urgently such as respiratory distress, impairment of bodily function or dysfunction of bodily organs.   Routine symptom specific, illness specific and/or disease specific instructions were discussed with the patient and/or caregiver at length.   As such, the patient has been evaluated and assessed, work-up was performed and treatment was provided in alignment with urgent care protocols and evidence based medicine.  Patient/parent/caregiver has been advised that the patient may require follow up for further testing and treatment if the symptoms continue in spite of treatment, as clinically indicated and appropriate.  If the patient was tested for COVID-19, Influenza and/or RSV, then the patient/parent/guardian was advised to isolate at home pending the results of his/her diagnostic coronavirus test and potentially longer if they're positive. I have also advised pt that if his/her COVID-19 test returns positive, it's recommended to self-isolate for at least 10 days after symptoms first appeared AND until fever-free for 24 hours without fever reducer AND other symptoms have improved or resolved. Discussed self-isolation recommendations as well as instructions for household member/close contacts as per the [facility] and [place] [facility]HS, and also gave patient the COVID packet with this information.  Patient/parent/caregiver has been advised [facility]CP in 3-5 days if no better; to PCP or the Emergency Department if new signs and symptoms develop, or if the current signs or symptoms continue to change or worsen for further workup, evaluation and treatment as clinically  indicated and appropriate  The patient will follow up with their current PCP if and as advised. If the patient does not currently have a PCP we will assist them in obtaining one.   The patient may need specialty follow up if the symptoms continue, in spite of conservative treatment and management, for further workup, evaluation, consultation and treatment as clinically indicated and appropriate.  Patient/parent/caregiver verbalized understanding and agreement of plan as discussed.  All questions were addressed during visit.  Please see discharge instructions below for further details of plan.  Discharge Instructions:   Discharge Instructions      Please read below to learn more about the medications, dosages and frequencies that I recommend to help alleviate your symptoms and to get you feeling better soon:   Z-Pak (azithromycin):  take 2 tablets the first day, then take 1 tablet daily every day thereafter until complete.      Solu-Medrol IM (methylprednisolone):  To quickly address your significant respiratory inflammation, you were provided with an injection of Solu-Medrol in the office today.  You should continue to feel the full benefit of the steroid for the next 4 to 6 hours.    Medrol Dosepak (methylprednisolone): This is a steroid that will significantly calm your upper and lower airways, please take the daily recommended quantity of tablets daily with your breakfast meal starting tomorrow morning until the prescription is complete.      Atrovent (ipratropium): This is an excellent nasal decongestant spray that does not cause rebound congestion, please instill 2 sprays into each nare with each use.  Please use the spray up to 4 times daily as needed.  I have  provided you with a prescription for this medication.      ProAir, Ventolin, Proventil (albuterol): This inhaled medication contains a short acting beta agonist bronchodilator.  This medication works on the smooth muscle that  opens and constricts of your airways by relaxing the muscle.  The result of relaxation of the smooth muscle is increased air movement and improved work of breathing.  This is a short acting medication that can be used every 4-6 hours as needed for increased work of breathing, shortness of breath, wheezing and excessive coughing.  I have provided you with a prescription.    Robitussin, Mucinex (guaifenesin): This is an expectorant.  This helps break up chest congestion and loosen up thick nasal drainage making phlegm and drainage more liquid and therefore easier to remove.  I recommend being 400 mg three times daily as needed.      Promethazine DM: Promethazine is both a nasal decongestant and an antinausea medication that makes most patients feel fairly sleepy.  The DM is dextromethorphan, a cough suppressant found in many over-the-counter cough medications.  Please take 5 mL before bedtime to minimize your cough which will help you sleep better.  I have sent a prescription for this medication to your pharmacy.   Please follow-up within the next 5-7 days either with your primary care provider or urgent care if your symptoms do not resolve.  If you do not have a primary care provider, we will assist you in finding one.        Thank you for visiting urgent care today.  We appreciate the opportunity to participate in your care.         This office note has been dictated using Teaching laboratory technician.  Unfortunately, this method of dictation can sometimes lead to typographical or grammatical errors.  I apologize for your inconvenience in advance if this occurs.  Please do not hesitate to reach out to me if clarification is needed.      Theadora Rama Scales, [place] 05/18/22 1926

## 2022-05-17 NOTE — ED Triage Notes (Addendum)
Pt states Friday she began having SOB, and cough. The patient states at home covid test was negative last night.

## 2022-12-27 IMAGING — DX DG HAND COMPLETE 3+V*R*
3 series · 3 of 3 positions shown · non-contrast
Comparison: None Available.

CLINICAL DATA: Trauma, pain

EXAM:
RIGHT HAND - COMPLETE 3+ VIEW

[hand ap]
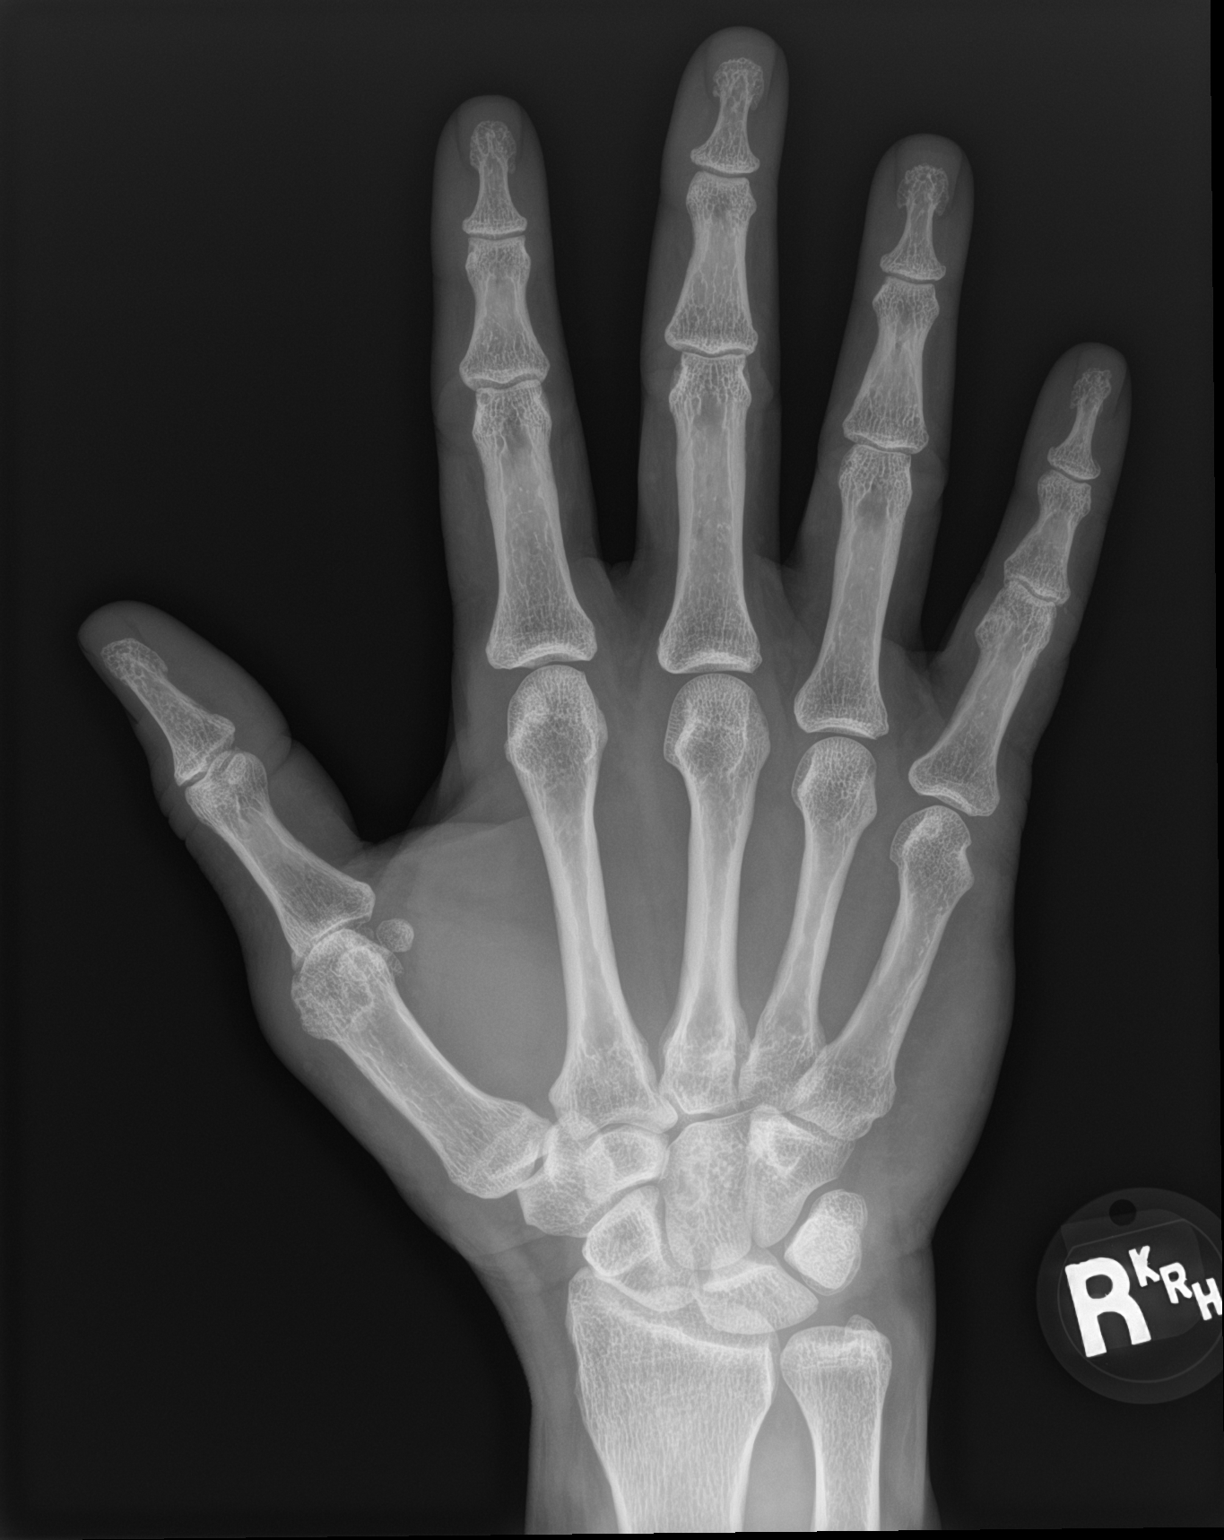

[hand obl]
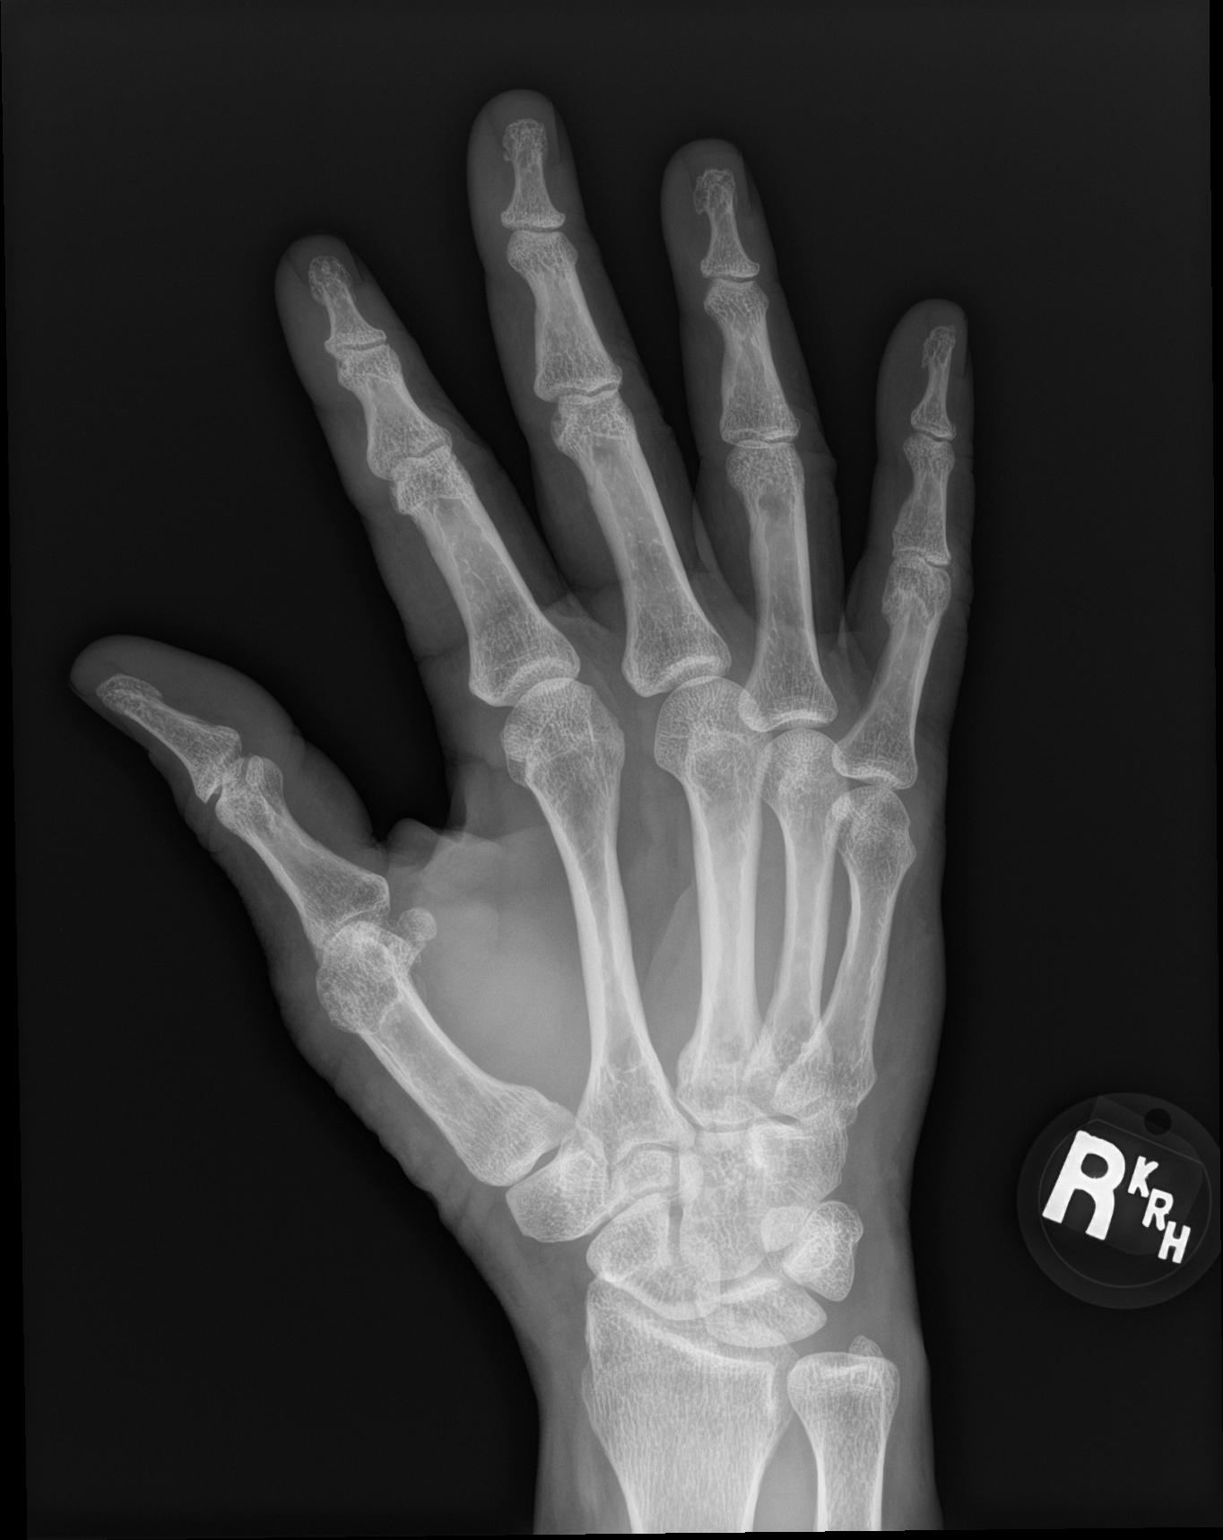

[hand lat]
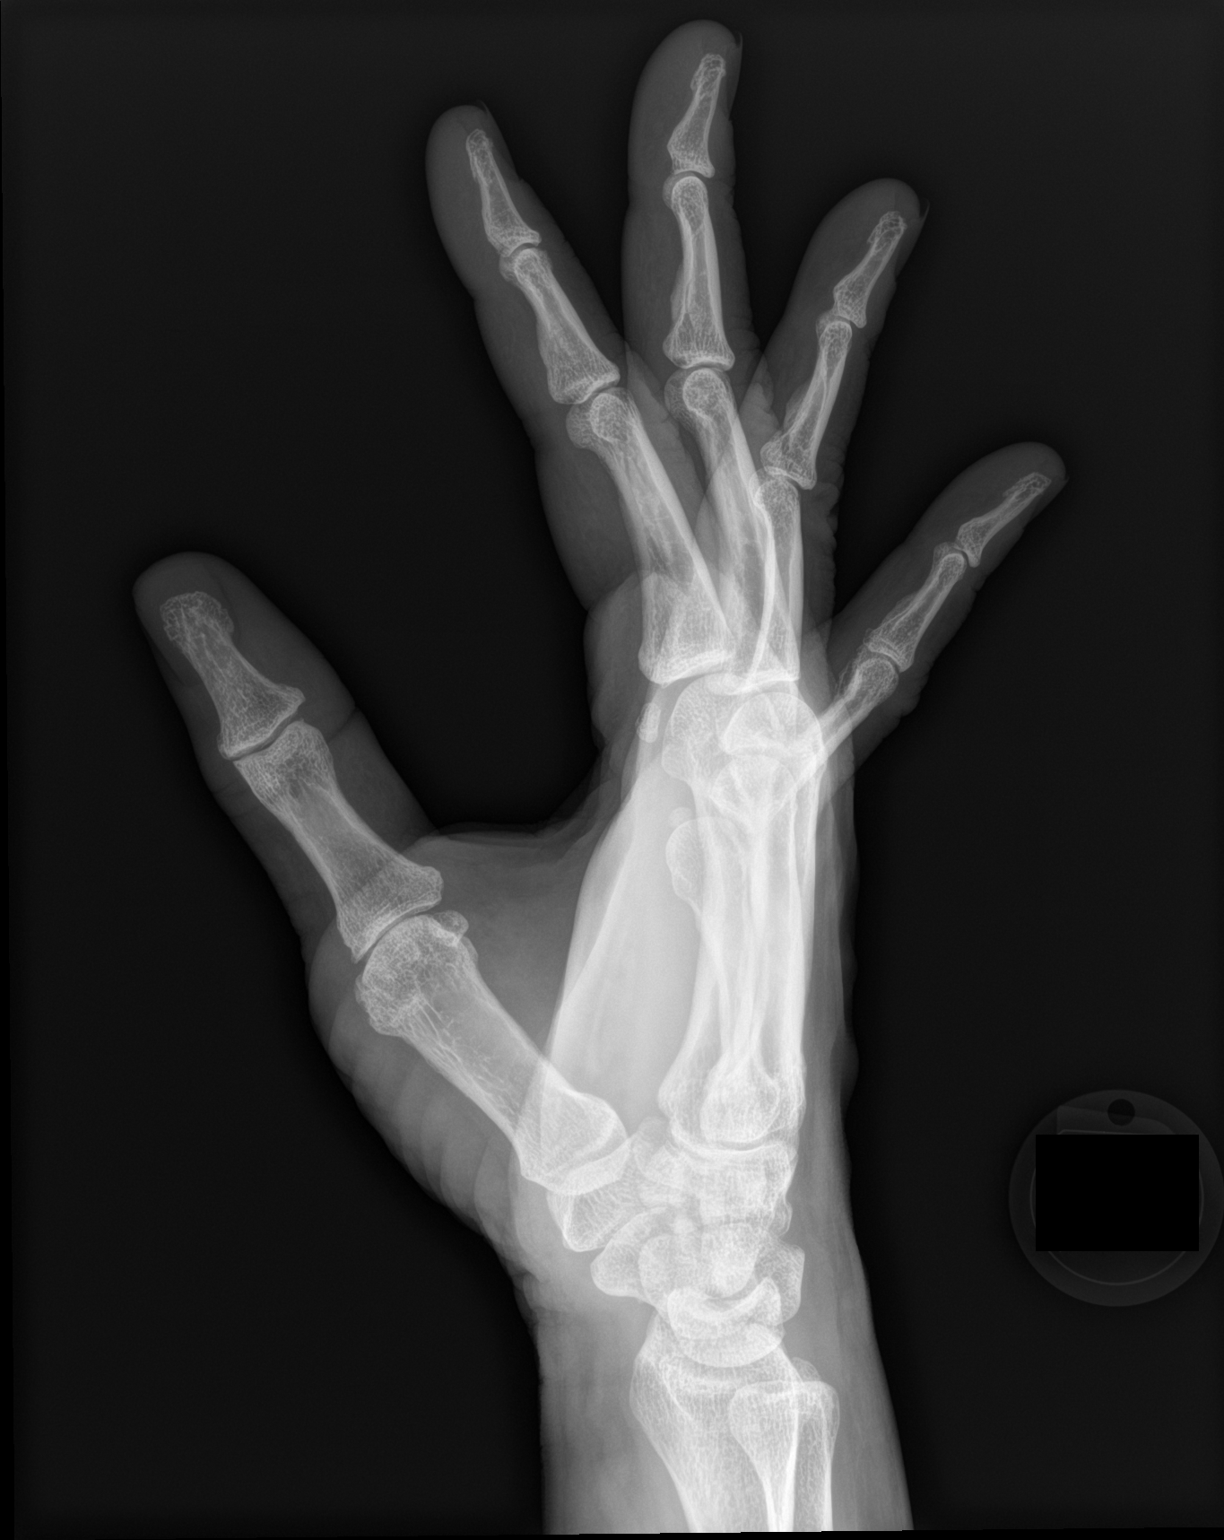

[3 of 3 positions shown; findings below may reference images not displayed]

FINDINGS: No fracture or dislocation is seen in the right hand. Degenerative
changes are noted in the first metacarpophalangeal joint. Minimal
bony spurs seen in the interphalangeal joint of the right thumb.
There are no opaque foreign bodies.
IMPRESSION: No recent fracture or dislocation is seen. Moderate degenerative
changes are noted in the right first metacarpophalangeal joint.
Minimal degenerative changes are noted in the interphalangeal joint
of right thumb.
